# Patient Record
Sex: Male | Born: 1990 | Race: Black or African American | Hispanic: No | Marital: Single | Smoking: Never smoker
Health system: Southern US, Community
[De-identification: ages and names within clinical notes are randomized; demographics above are authoritative.]

---

## 2018-03-09 ENCOUNTER — Emergency Department (HOSPITAL_COMMUNITY): Payer: No Typology Code available for payment source

## 2018-03-09 ENCOUNTER — Encounter (HOSPITAL_COMMUNITY): Payer: Self-pay | Admitting: Emergency Medicine

## 2018-03-09 ENCOUNTER — Emergency Department (HOSPITAL_COMMUNITY)
Admission: EM | Admit: 2018-03-09 | Discharge: 2018-03-09 | Disposition: A | Discharge status: Home / Self Care | Payer: No Typology Code available for payment source | Attending: Emergency Medicine | Admitting: Emergency Medicine

## 2018-03-09 DIAGNOSIS — S9031XA Contusion of right foot, initial encounter: Secondary | ICD-10-CM

## 2018-03-09 DIAGNOSIS — S99921A Unspecified injury of right foot, initial encounter: Secondary | ICD-10-CM | POA: Diagnosis present

## 2018-03-09 DIAGNOSIS — Y999 Unspecified external cause status: Secondary | ICD-10-CM | POA: Diagnosis not present

## 2018-03-09 DIAGNOSIS — Y92410 Unspecified street and highway as the place of occurrence of the external cause: Secondary | ICD-10-CM | POA: Insufficient documentation

## 2018-03-09 DIAGNOSIS — Y9301 Activity, walking, marching and hiking: Secondary | ICD-10-CM | POA: Insufficient documentation

## 2018-03-09 MED ORDER — ACETAMINOPHEN 500 MG PO TABS
1000.0000 mg | ORAL_TABLET | Freq: Once | ORAL | Status: AC
  Administered 2018-03-09: 1000 mg via ORAL
  Filled 2018-03-09: qty 2

## 2018-03-09 NOTE — ED Triage Notes (Signed)
Pt ambulatory in triage NAD noted.
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
## 2018-03-09 NOTE — ED Notes (Signed)
Patient verbalizes understanding of discharge instructions. Opportunity for questioning and answers were provided. Armband removed by staff, pt discharged from ED ambulatory by self\  

## 2018-03-09 NOTE — Discharge Instructions (Signed)
You can take Tylenol or Ibuprofen as directed for pain. You can alternate Tylenol and Ibuprofen every 4 hours. If you take Tylenol at 1pm, then you can take Ibuprofen at 5pm. Then you can take Tylenol again at 9pm.   Follow the RICE (Rest, Ice, Compression, Elevation) protocol as directed.   Follow-up with [facility] to establish a primary care doctor if you do not have one.   Return emergency department for any worsening pain, worsening redness or swelling, difficulty walking, numbness/weakness or any other worsening or concerning symptoms.
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
